# Patient Record
Sex: Female | Born: 1960 | Race: Black or African American | Hispanic: No | Marital: Single | Smoking: Never smoker
Health system: Southern US, Community
[De-identification: ages and names within clinical notes are randomized; demographics above are authoritative.]

## PROBLEM LIST (undated history)

## (undated) DIAGNOSIS — Z800 Family history of malignant neoplasm of digestive organs: Secondary | ICD-10-CM

## (undated) DIAGNOSIS — Z803 Family history of malignant neoplasm of breast: Secondary | ICD-10-CM

## (undated) DIAGNOSIS — Z8041 Family history of malignant neoplasm of ovary: Secondary | ICD-10-CM

## (undated) DIAGNOSIS — E041 Nontoxic single thyroid nodule: Secondary | ICD-10-CM

## (undated) DIAGNOSIS — T7840XA Allergy, unspecified, initial encounter: Secondary | ICD-10-CM

## (undated) HISTORY — DX: Nontoxic single thyroid nodule: E04.1

## (undated) HISTORY — PX: WISDOM TOOTH EXTRACTION: SHX21

## (undated) HISTORY — DX: Allergy, unspecified, initial encounter: T78.40XA

## (undated) HISTORY — DX: Family history of malignant neoplasm of ovary: Z80.41

## (undated) HISTORY — DX: Family history of malignant neoplasm of breast: Z80.3

## (undated) HISTORY — DX: Family history of malignant neoplasm of digestive organs: Z80.0

---

## 2001-08-31 ENCOUNTER — Other Ambulatory Visit: Admission: RE | Admit: 2001-08-31 | Discharge: 2001-08-31 | Payer: Self-pay | Admitting: Family Medicine

## 2004-03-22 ENCOUNTER — Encounter: Admission: RE | Admit: 2004-03-22 | Discharge: 2004-03-22 | Payer: Self-pay | Admitting: Family Medicine

## 2007-10-26 ENCOUNTER — Other Ambulatory Visit: Admission: RE | Admit: 2007-10-26 | Discharge: 2007-10-26 | Payer: Self-pay | Admitting: Family Medicine

## 2007-11-01 ENCOUNTER — Ambulatory Visit (HOSPITAL_COMMUNITY): Admission: RE | Admit: 2007-11-01 | Discharge: 2007-11-01 | Payer: Self-pay | Admitting: Family Medicine

## 2011-11-25 LAB — HM PAP SMEAR: HM Pap smear: NORMAL

## 2012-01-06 ENCOUNTER — Other Ambulatory Visit (HOSPITAL_COMMUNITY)
Admission: RE | Admit: 2012-01-06 | Discharge: 2012-01-06 | Disposition: A | Discharge status: Home / Self Care | Payer: 59 | Source: Ambulatory Visit | Attending: Family Medicine | Admitting: Family Medicine

## 2012-01-06 ENCOUNTER — Other Ambulatory Visit: Payer: Self-pay | Admitting: Family Medicine

## 2012-01-06 DIAGNOSIS — Z0000 Encounter for general adult medical examination without abnormal findings: Secondary | ICD-10-CM | POA: Insufficient documentation

## 2012-01-07 ENCOUNTER — Other Ambulatory Visit (HOSPITAL_COMMUNITY): Payer: Self-pay | Admitting: Family Medicine

## 2012-01-07 DIAGNOSIS — E049 Nontoxic goiter, unspecified: Secondary | ICD-10-CM

## 2012-01-13 ENCOUNTER — Ambulatory Visit (HOSPITAL_COMMUNITY)
Admission: RE | Admit: 2012-01-13 | Discharge: 2012-01-13 | Disposition: A | Discharge status: Home / Self Care | Payer: 59 | Source: Ambulatory Visit | Attending: Family Medicine | Admitting: Family Medicine

## 2012-01-13 DIAGNOSIS — E049 Nontoxic goiter, unspecified: Secondary | ICD-10-CM | POA: Insufficient documentation

## 2012-01-29 ENCOUNTER — Other Ambulatory Visit (HOSPITAL_COMMUNITY): Payer: Self-pay | Admitting: Family Medicine

## 2012-01-29 DIAGNOSIS — E042 Nontoxic multinodular goiter: Secondary | ICD-10-CM

## 2012-02-02 ENCOUNTER — Other Ambulatory Visit: Payer: Self-pay | Admitting: Family Medicine

## 2012-02-02 DIAGNOSIS — E042 Nontoxic multinodular goiter: Secondary | ICD-10-CM

## 2012-02-19 ENCOUNTER — Other Ambulatory Visit (HOSPITAL_COMMUNITY)
Admission: RE | Admit: 2012-02-19 | Discharge: 2012-02-19 | Disposition: A | Discharge status: Home / Self Care | Payer: 59 | Source: Ambulatory Visit | Attending: Interventional Radiology | Admitting: Interventional Radiology

## 2012-02-19 ENCOUNTER — Ambulatory Visit
Admission: RE | Admit: 2012-02-19 | Discharge: 2012-02-19 | Disposition: A | Discharge status: Home / Self Care | Payer: 59 | Source: Ambulatory Visit | Attending: Family Medicine | Admitting: Family Medicine

## 2012-02-19 DIAGNOSIS — E049 Nontoxic goiter, unspecified: Secondary | ICD-10-CM | POA: Insufficient documentation

## 2012-02-19 DIAGNOSIS — E042 Nontoxic multinodular goiter: Secondary | ICD-10-CM

## 2012-04-16 ENCOUNTER — Ambulatory Visit: Payer: 59 | Admitting: Endocrinology

## 2012-04-29 ENCOUNTER — Ambulatory Visit (INDEPENDENT_AMBULATORY_CARE_PROVIDER_SITE_OTHER): Payer: 59 | Admitting: Endocrinology

## 2012-04-29 ENCOUNTER — Encounter: Payer: Self-pay | Admitting: Endocrinology

## 2012-04-29 VITALS — BP 150/82 | HR 59 | Temp 98.0°F | Ht 65.0 in | Wt 135.0 lb

## 2012-04-29 DIAGNOSIS — E042 Nontoxic multinodular goiter: Secondary | ICD-10-CM

## 2012-04-29 NOTE — Progress Notes (Signed)
  Subjective:    Patient ID: Judy Ortiz, female    DOB: Sep 10, 1961, 51 y.o.   MRN: [id number]  HPI Pt was noted at recent routine physical to have an enlarged thyroid.  She says she now notices the goiter.  She has few mos of slight swelling at the anterior neck, but no assoc pain.   Past Medical History  Diagnosis Date  . Thyroid nodule     No past surgical history on file.  History   Social History  . Marital Status: Single    Spouse Name: N/A    Number of Children: 0  . Years of Education: 16   Occupational History  . Admin [employer]   Social History Main Topics  . Smoking status: Never Smoker   . Smokeless tobacco: Not on file  . Alcohol Use: Not on file  . Drug Use: No  . Sexually Active: Not on file   Other Topics Concern  . Not on file   Social History Narrative  . No narrative on file    No current outpatient prescriptions on file prior to visit.    No Known Allergies  Family History  Problem Relation Age of Onset  . Hypertension Other     Grandparents  no goiter or other thyroid probs  BP 150/82  Pulse 59  Temp(Src) 98 F (36.7 C) (Oral)  Ht 5\' 5"  (1.651 m)  Wt 135 lb (61.236 kg)  BMI 22.47 kg/m2  SpO2 99%  LMP 04/15/2012  Review of Systems denies weight loss, headache, hoarseness, double vision, palpitations, sob, diarrhea, polyuria, myalgias, excessive diaphoresis, numbness, tremor, anxiety, easy bruising, and rhinorrhea.  She has reg menses.    Objective:   Physical Exam VS: see vs page GEN: no distress HEAD: head: no deformity eyes: no periorbital swelling, no proptosis mexternal nose and ears are normal mouth: no lesion seen NECK: supple, thyroid is 2x normal size on the right, with irreg surface CHEST WALL: no deformity LUNGS:  Clear to auscultation CV: reg rate and rhythm, no murmur MUSCULOSKELETAL: muscle bulk and strength are grossly normal.  no obvious joint swelling.  gait is normal and steady EXTEMITIES: no  edema PULSES: dorsalis pedis intact bilat.  NEURO:  cn 2-12 grossly intact.   readily moves all 4's.  sensation is intact to touch on the feet SKIN:  Normal texture and temperature.  No rash or suspicious lesion is visible.   NODES:  None palpable at the neck PSYCH: alert, oriented x3.  Does not appear anxious nor depressed.   Thyroid US: Right thyroid lobe: 14 x 20 x 59 mm, inhomogeneous  Left thyroid lobe: 9 x 12 x 44 mm  Isthmus: 3 mm in thickness  Focal nodules:  13 x 14 x 20 mm solid, mid-right  13 x 22 x 31 mm solid, inferior right  4 x 11 x 11 mm solid, mid isthmus  There are two small left thyroid cysts measuring 3 mm.  Lymphadenopathy: None visualized.  cytol of bx of 2 of the nodules:  Non-neoplastic goiter.      Assessment & Plan:  Multinodular goiter, which is usually hereditary.  She is at risk for hyperthyroidism. HTN, with probable situational component.

## 2012-04-29 NOTE — Patient Instructions (Signed)
most of the time, a "lumpy thyroid" will eventually become overactive.  this is usually a slow process, happening over the span of many years. You should have a repeat ultrasound and repeat thyroid blood test in 1 year.  i would be happy to do this, or i'm sure dr white would be happy to do--your choice.

## 2012-05-01 DIAGNOSIS — E042 Nontoxic multinodular goiter: Secondary | ICD-10-CM | POA: Insufficient documentation

## 2012-05-24 IMAGING — US US THYROID BIOPSY
1 series · 13 of 17 positions shown · non-contrast
Comparison: Thyroid ultrasound dated 01/13/2012

CLINICAL DATA: Dominant right sided thyroid nodules measuring
approximately 2.0 and 3.1 cm in respective maximal diameters.
These are located in the right lobe.

ULTRASOUND GUIDED NEEDLE ASPIRATE BIOPSY OF THE THYROID GLAND X 2

[Series 1: us thyroid biopsy · 0.08mm/px · 17 acquisitions, 13 frames shown]
[im 1/17]
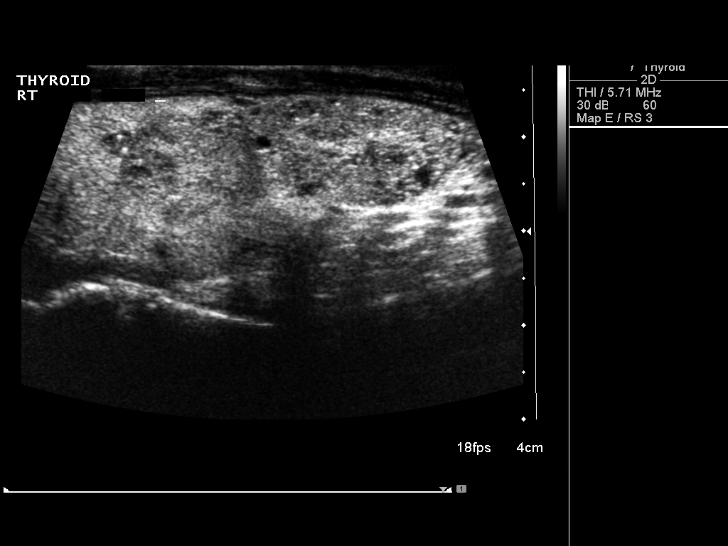
[im 2/17]
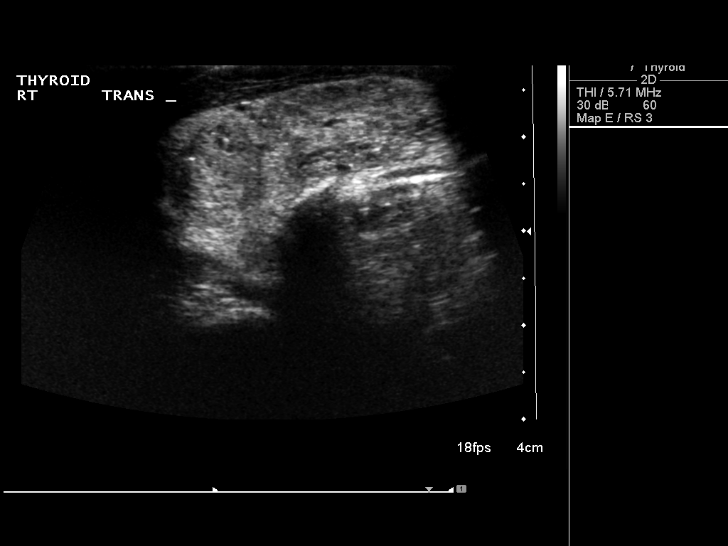
[im 4/17]
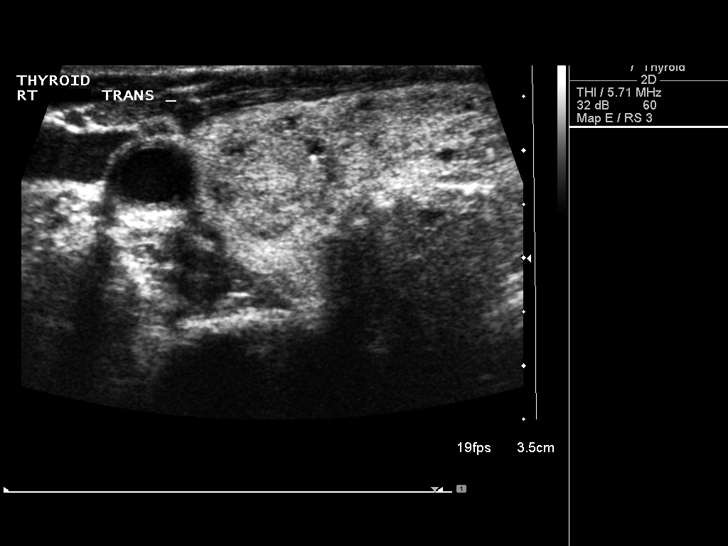
[im 5/17]
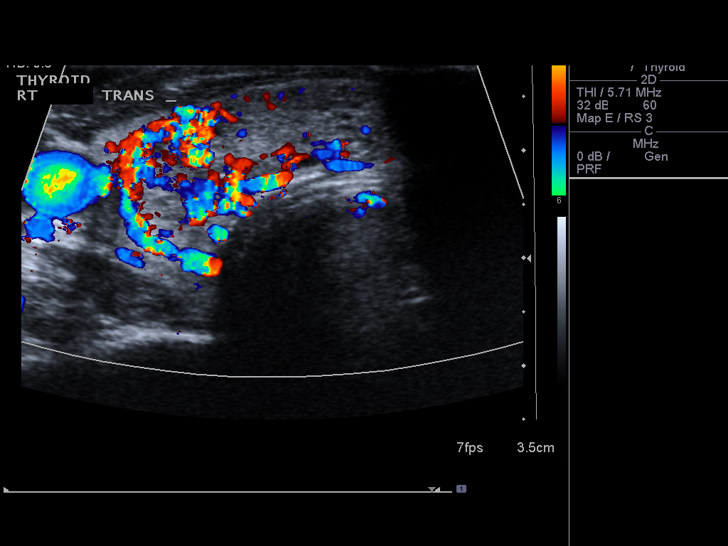
[im 6/17]
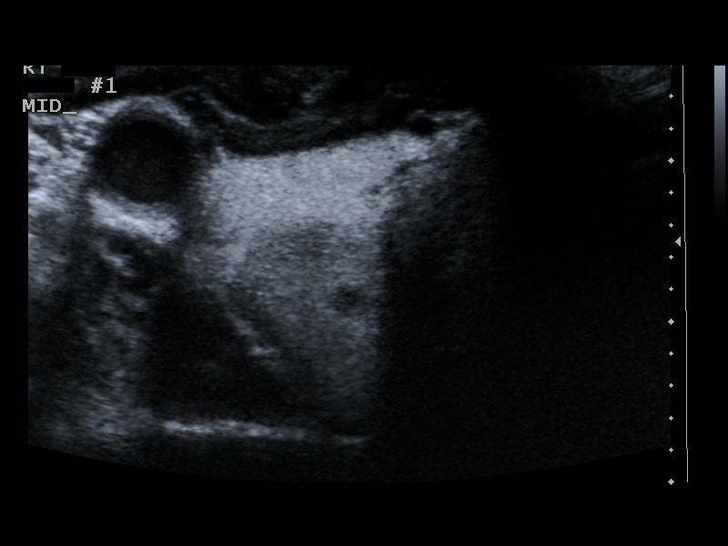
[im 8/17]
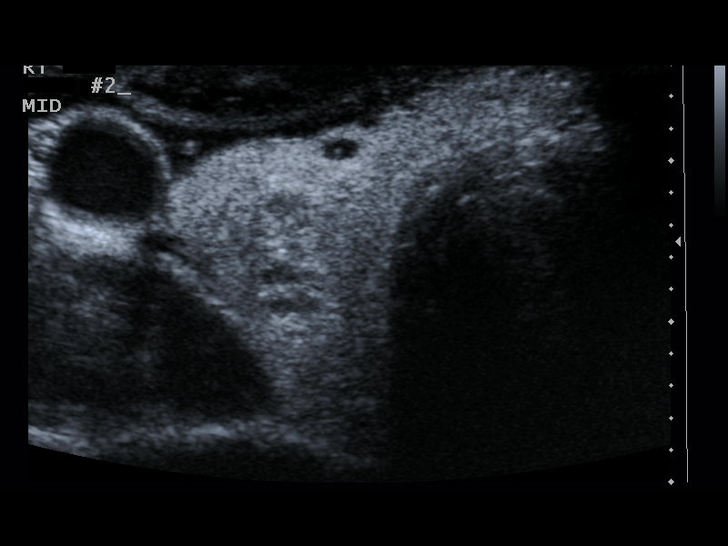
[im 9/17]
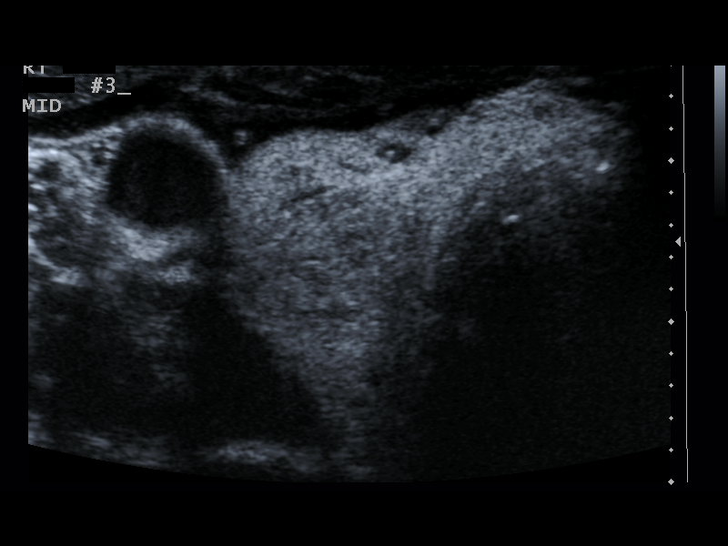
[im 10/17]
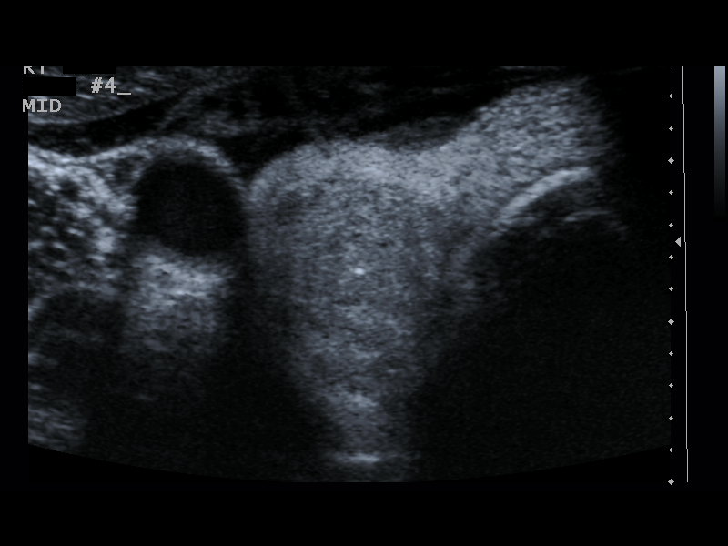
[im 12/17]
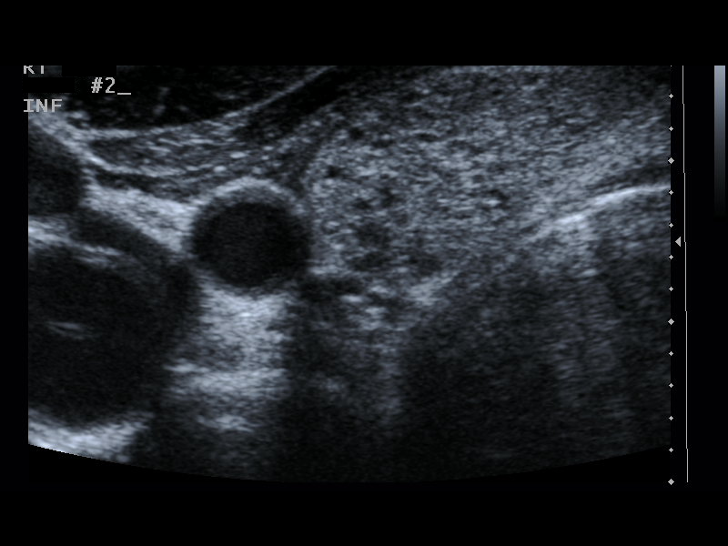
[im 13/17]
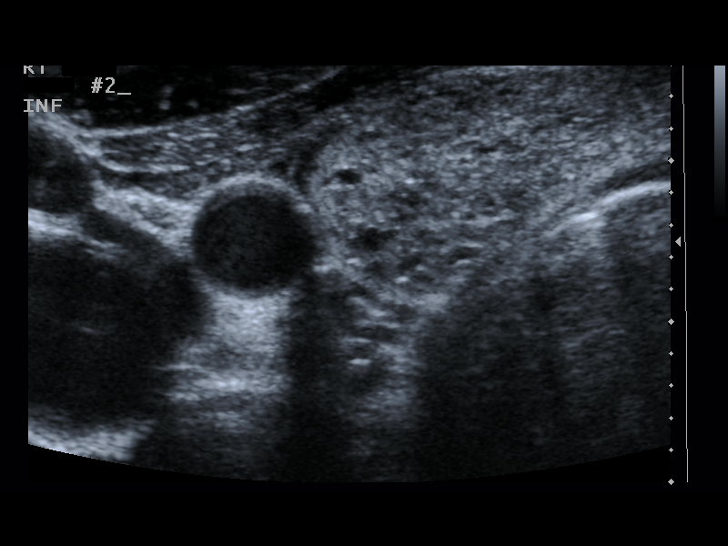
[im 14/17]
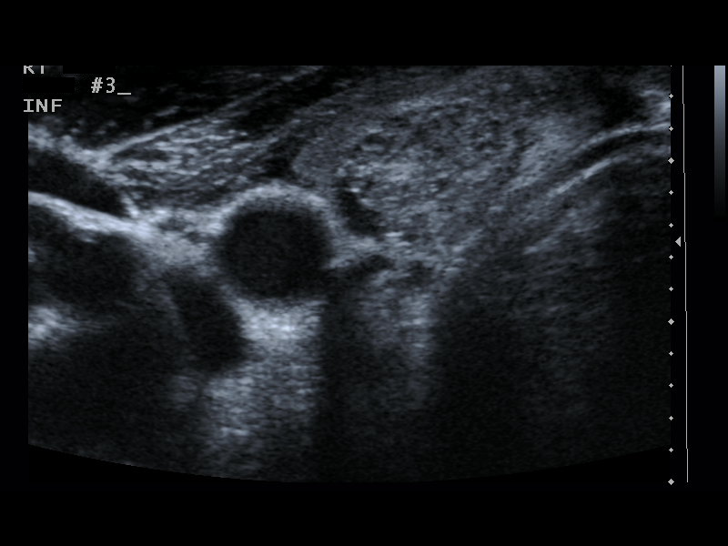
[im 16/17]
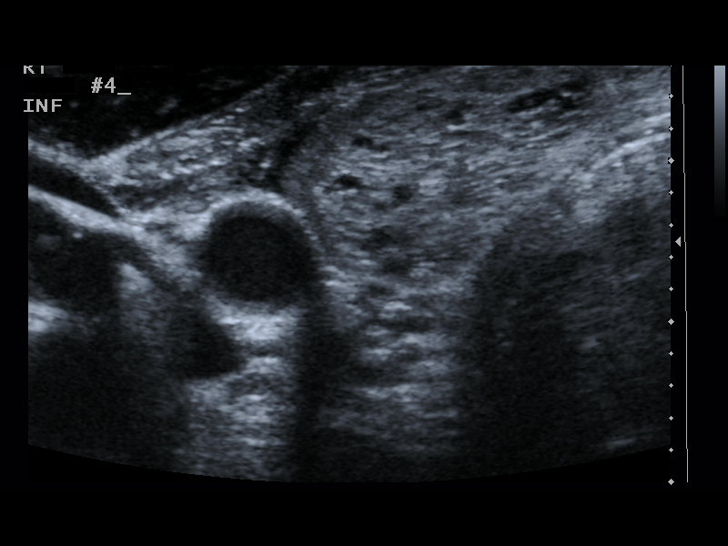
[im 17/17]
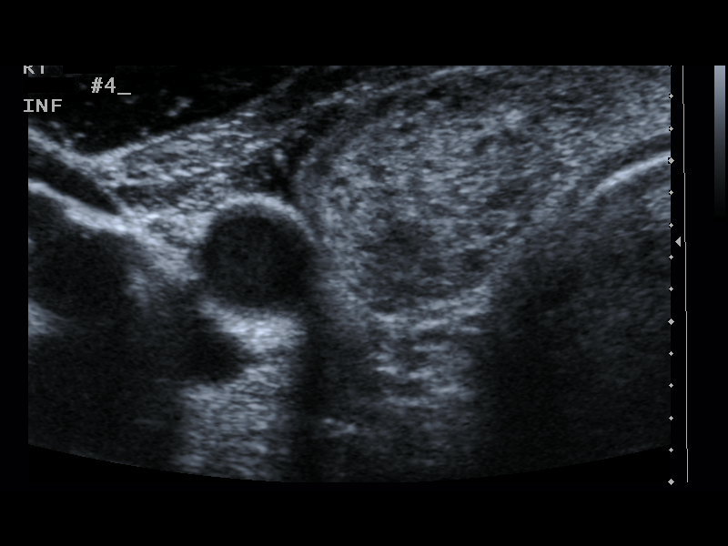

[13 of 17 positions shown; findings below may reference images not displayed]

Thyroid biopsy was thoroughly discussed with the patient and
questions were answered.  The benefits, risks, alternatives, and
complications were also discussed.  The patient understands and
wishes to proceed with the procedure.  Written consent was
obtained.

Ultrasound was performed to localize and mark an adequate site for
the biopsy.  The patient was then prepped and draped in a normal
sterile fashion.  Local anesthesia was provided with 1% lidocaine.

Using direct ultrasound guidance, 4 passes were made using 25 gauge
needles into the nodule within the mid right lobe of the thyroid.
This was followed by ultrasound guided aspirate of the lower right
thyroid nodule utilizing similar 25 gauge needles and four total
passes.

Ultrasound was used to confirm needle placements on all occasions.
Specimens were sent to Pathology for analysis.

Complications:  None
FINDINGS: There is no gross change in morphology of the right mid
and lower thyroid nodules which are located very close in proximity
to each other and blend into each other.
IMPRESSION: Ultrasound guided needle aspirate biopsy performed of two separate
dominant right thyroid nodules.

## 2015-05-23 ENCOUNTER — Other Ambulatory Visit: Payer: Self-pay | Admitting: Family Medicine

## 2015-05-23 ENCOUNTER — Other Ambulatory Visit (HOSPITAL_COMMUNITY)
Admission: RE | Admit: 2015-05-23 | Discharge: 2015-05-23 | Disposition: A | Discharge status: Home / Self Care | Payer: 59 | Source: Ambulatory Visit | Attending: Family Medicine | Admitting: Family Medicine

## 2015-05-23 DIAGNOSIS — Z01419 Encounter for gynecological examination (general) (routine) without abnormal findings: Secondary | ICD-10-CM | POA: Insufficient documentation

## 2015-05-24 LAB — CYTOLOGY - PAP

## 2015-08-07 ENCOUNTER — Encounter: Payer: Self-pay | Admitting: Gastroenterology

## 2015-10-12 ENCOUNTER — Ambulatory Visit (AMBULATORY_SURGERY_CENTER): Payer: Self-pay | Admitting: *Deleted

## 2015-10-12 ENCOUNTER — Telehealth: Payer: Self-pay | Admitting: *Deleted

## 2015-10-12 VITALS — Ht 65.5 in | Wt 145.0 lb

## 2015-10-12 DIAGNOSIS — Z1211 Encounter for screening for malignant neoplasm of colon: Secondary | ICD-10-CM

## 2015-10-12 MED ORDER — NA SULFATE-K SULFATE-MG SULF 17.5-3.13-1.6 GM/177ML PO SOLN: 1.0000 | Freq: Once | ORAL | Status: DC

## 2015-10-12 NOTE — Progress Notes (Signed)
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 Her reaction is that they make her "feel funny" . She doesn't eat eggs, she has gluten intolerance , she does ok with soy she just doesn't do soy products  No issues with past sedation with wisdom teeth No diet pills No home 02 use   emmi video to [email or web address]

## 2015-10-12 NOTE — Telephone Encounter (Signed)
Judy Ortiz, This pt , who is a [employer] employee states she is allergic to eggs. But sounds like intolerance. She states they make jer feel funny. She doesn't eat them at all. No soy allergy but she doesn't do soy either. Just an FYI to make sure she is good for propofol.  Thanks [employer]

## 2015-10-14 NOTE — Telephone Encounter (Signed)
Marie,  This pt is cleared for anesthetic care at [facility].  Thanks,  Oluwatimileyin Vivier 

## 2015-10-15 NOTE — Telephone Encounter (Signed)
Will proceed as scheduled  

## 2015-10-26 ENCOUNTER — Encounter: Payer: Self-pay | Admitting: Gastroenterology

## 2015-10-26 ENCOUNTER — Ambulatory Visit (AMBULATORY_SURGERY_CENTER): Payer: 59 | Admitting: Gastroenterology

## 2015-10-26 VITALS — BP 112/55 | HR 53 | Temp 96.7°F | Resp 16 | Ht 65.0 in | Wt 145.0 lb

## 2015-10-26 DIAGNOSIS — Z1211 Encounter for screening for malignant neoplasm of colon: Secondary | ICD-10-CM | POA: Diagnosis present

## 2015-10-26 DIAGNOSIS — D122 Benign neoplasm of ascending colon: Secondary | ICD-10-CM

## 2015-10-26 MED ORDER — SODIUM CHLORIDE 0.9 % IV SOLN: 500.0000 mL | INTRAVENOUS | Status: DC

## 2015-10-26 NOTE — Progress Notes (Signed)
Called to room to assist during endoscopic procedure.  Patient ID and intended procedure confirmed with present staff. Received instructions for my participation in the procedure from the performing physician.  

## 2015-10-26 NOTE — Op Note (Signed)
[place]  [address] [place], [address]   COLONOSCOPY PROCEDURE REPORT  PATIENT: Judy Ortiz, Judy Ortiz  MR#: [id number] BIRTHDATE: Nov 20, 1961 , 77  yrs. old GENDER: female ENDOSCOPIST: Milus Banister, MD REFERRED [id number] Dema Severin, M.D. PROCEDURE DATE:  10/26/2015 PROCEDURE:   Colonoscopy, screening and Colonoscopy with snare polypectomy First Screening Colonoscopy - Avg.  risk and is 50 yrs.  old or older Yes.  Prior Negative Screening - Now for repeat screening. N/A  History of Adenoma - Now for follow-up colonoscopy & has been > or = to 3 yrs.  N/A  Polyps removed today? Yes ASA CLASS:   Class II INDICATIONS:Screening for colonic neoplasia and Colorectal Neoplasm Risk Assessment for this procedure is average risk. MEDICATIONS: Monitored anesthesia care and Propofol 200 mg IV  DESCRIPTION OF PROCEDURE:   After the risks benefits and alternatives of the procedure were thoroughly explained, informed consent was obtained.  The digital rectal exam revealed no abnormalities of the rectum.   The LB [id number]  endoscope was introduced through the anus and advanced to the cecum, which was identified by both the appendix and ileocecal valve. No adverse events experienced.   The quality of the prep was excellent.  The instrument was then slowly withdrawn as the colon was fully examined. Estimated blood loss is zero unless otherwise noted in this procedure report.   COLON FINDINGS: A sessile polyp measuring 4 mm in size was found in the ascending colon.  A polypectomy was performed with a cold snare.  The resection was complete, the polyp tissue was completely retrieved and sent to histology.   The examination was otherwise normal.  Retroflexed views revealed no abnormalities. The time to cecum = 3.2 Withdrawal time = 13.1   The scope was withdrawn and the procedure completed. COMPLICATIONS: There were no immediate complications.  ENDOSCOPIC IMPRESSION: 1.    Sessile polyp was found in the ascending colon; polypectomy was performed with a cold snare 2.   The examination was otherwise normal  RECOMMENDATIONS: If the polyp(s) removed today are proven to be adenomatous (pre-cancerous) polyps, you will need a repeat colonoscopy in 5 years.  Otherwise you should continue to follow colorectal cancer screening guidelines for "routine risk" patients with colonoscopy in 10 years.  You will receive a letter within 1-2 weeks with the results of your biopsy as well as final recommendations.  Please call my office if you have not received a letter after 3 weeks.  eSigned:  Milus Banister, MD 10/26/2015 10:10 AM

## 2015-10-26 NOTE — Progress Notes (Signed)
Report to PACU, RN, vss, BBS= Clear.  

## 2015-10-26 NOTE — Patient Instructions (Signed)
YOU HAD AN ENDOSCOPIC PROCEDURE TODAY AT THE [facility]:   Refer to the procedure report that was given to you for any specific questions about what was found during the examination.  If the procedure report does not answer your questions, please call your gastroenterologist to clarify.  If you requested that your care partner not be given the details of your procedure findings, then the procedure report has been included in a sealed envelope for you to review at your convenience later.  YOU SHOULD EXPECT: Some feelings of bloating in the abdomen. Passage of more gas than usual.  Walking can help get rid of the air that was put into your GI tract during the procedure and reduce the bloating. If you had a lower endoscopy (such as a colonoscopy or flexible sigmoidoscopy) you may notice spotting of blood in your stool or on the toilet paper. If you underwent a bowel prep for your procedure, you may not have a normal bowel movement for a few days.  Please Note:  You might notice some irritation and congestion in your nose or some drainage.  This is from the oxygen used during your procedure.  There is no need for concern and it should clear up in a day or so.  SYMPTOMS TO REPORT IMMEDIATELY:   Following lower endoscopy (colonoscopy or flexible sigmoidoscopy):  Excessive amounts of blood in the stool  Significant tenderness or worsening of abdominal pains  Swelling of the abdomen that is new, acute  Fever of 100F or higher   For urgent or emergent issues, a gastroenterologist can be reached at any hour by calling [phone].   DIET: Your first meal following the procedure should be a small meal and then it is ok to progress to your normal diet. Heavy or fried foods are harder to digest and may make you feel nauseous or bloated.  Likewise, meals heavy in dairy and vegetables can increase bloating.  Drink plenty of fluids but you should avoid alcoholic beverages for 24  hours.  ACTIVITY:  You should plan to take it easy for the rest of today and you should NOT DRIVE or use heavy machinery until tomorrow (because of the sedation medicines used during the test).    FOLLOW UP: Our staff will call the number listed on your records the next business day following your procedure to check on you and address any questions or concerns that you may have regarding the information given to you following your procedure. If we do not reach you, we will leave a message.  However, if you are feeling well and you are not experiencing any problems, there is no need to return our call.  We will assume that you have returned to your regular daily activities without incident.  If any biopsies were taken you will be contacted by phone or by letter within the next 1-3 weeks.  Please call us at [phone] if you have not heard about the biopsies in 3 weeks.    SIGNATURES/CONFIDENTIALITY: You and/or your care partner have signed paperwork which will be entered into your electronic medical record.  These signatures attest to the fact that that the information above on your After Visit Summary has been reviewed and is understood.  Full responsibility of the confidentiality of this discharge information lies with you and/or your care-partner. 

## 2015-10-29 ENCOUNTER — Telehealth: Payer: Self-pay

## 2015-10-29 NOTE — Telephone Encounter (Signed)
  Follow up Call-  Call back number 10/26/2015  Post procedure Call Back phone  # [phone]  Permission to leave phone message Yes     Patient questions:  Do you have a fever, pain , or abdominal swelling? No. Pain Score  0 *  Have you tolerated food without any problems? Yes.    Have you been able to return to your normal activities? Yes.    Do you have any questions about your discharge instructions: Diet   No. Medications  No. Follow up visit  No.  Do you have questions or concerns about your Care? No.  Actions: * If pain score is 4 or above: No action needed, pain <4.

## 2015-11-05 ENCOUNTER — Encounter: Payer: Self-pay | Admitting: Gastroenterology

## 2016-07-04 DIAGNOSIS — Z8041 Family history of malignant neoplasm of ovary: Secondary | ICD-10-CM | POA: Diagnosis not present

## 2016-07-04 DIAGNOSIS — Z13220 Encounter for screening for lipoid disorders: Secondary | ICD-10-CM | POA: Diagnosis not present

## 2016-07-04 DIAGNOSIS — E041 Nontoxic single thyroid nodule: Secondary | ICD-10-CM | POA: Diagnosis not present

## 2016-07-04 DIAGNOSIS — Z0000 Encounter for general adult medical examination without abnormal findings: Secondary | ICD-10-CM | POA: Diagnosis not present

## 2016-08-01 ENCOUNTER — Telehealth: Payer: Self-pay | Admitting: Genetic Counselor

## 2016-08-01 ENCOUNTER — Encounter: Payer: Self-pay | Admitting: Genetic Counselor

## 2016-08-01 NOTE — Telephone Encounter (Signed)
Pt called to schedule a genetic counseling appt. Appt with Roma Kayser on 9/11 at 10am. Pt aware to arrive 30 minutes early.

## 2016-08-04 ENCOUNTER — Encounter: Payer: Self-pay | Admitting: Genetic Counselor

## 2016-08-04 ENCOUNTER — Ambulatory Visit (HOSPITAL_BASED_OUTPATIENT_CLINIC_OR_DEPARTMENT_OTHER): Payer: 59 | Admitting: Genetic Counselor

## 2016-08-04 ENCOUNTER — Other Ambulatory Visit: Payer: 59

## 2016-08-04 DIAGNOSIS — Z803 Family history of malignant neoplasm of breast: Secondary | ICD-10-CM | POA: Diagnosis not present

## 2016-08-04 DIAGNOSIS — Z8041 Family history of malignant neoplasm of ovary: Secondary | ICD-10-CM

## 2016-08-04 DIAGNOSIS — E042 Nontoxic multinodular goiter: Secondary | ICD-10-CM

## 2016-08-04 DIAGNOSIS — Z800 Family history of malignant neoplasm of digestive organs: Secondary | ICD-10-CM | POA: Diagnosis not present

## 2016-08-04 NOTE — Progress Notes (Signed)
REFERRING PROVIDER: Harlan Stains, MD [address], [place] [address]  PRIMARY PROVIDER:  Vidal Schwalbe, MD  PRIMARY REASON FOR VISIT:  1. Multinodular goiter   2. Family history of ovarian cancer   3. Family history of colon cancer   4. Family history of breast cancer      HISTORY OF PRESENT ILLNESS:   Ms. Gudino, a 55 y.o. female, was seen for a [facility] cancer genetics consultation at the request of Dr. Dema Severin due to a family history of cancer.  Ms. Harnden presents to clinic today to discuss the possibility of a hereditary predisposition to cancer, genetic testing, and to further clarify her future cancer risks, as well as potential cancer risks for family members.  Ms. Parkhurst is a 55 y.o. female with no personal history of cancer.  Her mother was diagnosed with ovarian cancer as well as her maternal aunt.  She is interested in undergoing genetic testing.  CANCER HISTORY:   No history exists.     HORMONAL RISK FACTORS:  Menarche was at age 26-13.  First live birth at age 89.  OCP use for approximately 25 years.  Ovaries intact: yes.  Hysterectomy: no.  Menopausal status: perimenopausal.  HRT use: 0 years. Colonoscopy: yes; 1 polyps. Mammogram within the last year: no. Number of breast biopsies: 0. Up to date with pelvic exams:  yes. Any excessive radiation exposure in the past:  no  Past Medical History:  Diagnosis Date  . Allergy    seasonal  . Family history of breast cancer   . Family history of colon cancer   . Family history of ovarian cancer   . Thyroid nodule     Past Surgical History:  Procedure Laterality Date  . WISDOM TOOTH EXTRACTION     with sedation    Social History   Social History  . Marital status: Single    Spouse name: N/A  . Number of children: 0  . Years of education: 39   Occupational History  . [employer]   Social History Main Topics  . Smoking status: Never Smoker  . Smokeless tobacco: Never  Used  . Alcohol use 0.0 oz/week     Comment: occasionally  . Drug use: No  . Sexual activity: Not Asked   Other Topics Concern  . None   Social History Narrative  . None     FAMILY HISTORY:  We obtained a detailed, 4-generation family history.  Significant diagnoses are listed below: Family History  Problem Relation Age of Onset  . Hypertension Other     Grandparents  . Colon cancer Maternal Grandmother     80's  . Ovarian cancer Mother 69  . Liver disease Father   . Ovarian cancer Maternal Aunt 62  . Breast cancer Other     MGF's mother  . Colon polyps Neg Hx   . Esophageal cancer Neg Hx   . Rectal cancer Neg Hx   . Stomach cancer Neg Hx     The patient does not have children and is the only child to her parents.  Her mother was diagnosed and died of ovarian cancer at age 54.  She has a full sister and a maternal half sister.  The full sister was diagnosed with ovarian cancer at 47 and died at 28.  The patient's maternal grandmother was diagnosed with colon cancer in her 26's and her maternal grandfather's mother had breast cancer.  The patient's father died of  liver disease at 73.  He had two brothers and three sisters.  One sister had an unknown form of cancer and this sister's daughter died of an unknown form of cancer.  There is no other reported family history of cancer.  Patient's maternal ancestors are of [place] American and Native American descent, and paternal ancestors are of African American descent. There is no reported Ashkenazi Jewish ancestry. There is no known consanguinity.  GENETIC COUNSELING ASSESSMENT: CHARMIN AGUINIGA is a 55 y.o. female with a family history of ovarian, colon and breast cancer which is somewhat suggestive of a hereditary cancer syndrome and predisposition to cancer. We, therefore, discussed and recommended the following at today's visit.   DISCUSSION: We discussed that about 15-20% of ovarian cancer is due to hereditary causes.  Most commonly  it is associated with BRCA mutations, but sometimes it can be associated with Lynch syndrome.  There are other genes that can also increase the risk for ovarian cancer, but are not as well delineated.  We reviewed the characteristics, features and inheritance patterns of hereditary cancer syndromes. We also discussed genetic testing, including the appropriate family members to test, the process of testing, insurance coverage and turn-around-time for results. We discussed the implications of a negative, positive and/or variant of uncertain significant result. We recommended Ms. Halberg pursue genetic testing for the Breast/Ovarian cancer gene panel. The Breast/Ovarian gene panel offered by [facility] includes sequencing and rearrangement analysis for the following 20 genes:  ATM, BARD1, BRCA1, BRCA2, BRIP1, CDH1, CHEK2, EPCAM, FANCC, MLH1, MSH2, MSH6, NBN, PALB2, PMS2, PTEN, RAD51C, RAD51D, TP53, and XRCC2.     Based on Ms. Pietila's family history of cancer, she meets medical criteria for genetic testing. Despite that she meets criteria, she may still have an out of pocket cost. We discussed that if her out of pocket cost for testing is over $100, the laboratory will call and confirm whether she wants to proceed with testing.  If the out of pocket cost of testing is less than $100 she will be billed by the genetic testing laboratory.   PLAN: After considering the risks, benefits, and limitations, Ms. Patton  provided informed consent to pursue genetic testing and the blood sample was sent to [facility] for analysis of the Breast/Ovarian cancer gene panel. Results should be available within approximately 2-3 weeks' time, at which point they will be disclosed by telephone to Ms. Taite, as will any additional recommendations warranted by these results. Ms. Fulghum will receive a summary of her genetic counseling visit and a copy of her results once available. This information will also be available in Epic. We  encouraged Ms. Yannuzzi to remain in contact with cancer genetics annually so that we can continuously update the family history and inform her of any changes in cancer genetics and testing that may be of benefit for her family. Ms. Maxim questions were answered to her satisfaction today. Our contact information was provided should additional questions or concerns arise.  Lastly, we encouraged Ms. Steers to remain in contact with cancer genetics annually so that we can continuously update the family history and inform her of any changes in cancer genetics and testing that may be of benefit for this family.   Ms.  Peabody questions were answered to her satisfaction today. Our contact information was provided should additional questions or concerns arise. Thank you for the referral and allowing [place] to share in the care of your patient.   Quaran Kedzierski P. Florene Glen, MS, [place]  Certified [email or web address] phone: [phone]  The patient was seen for a total of 30 minutes in face-to-face genetic counseling.  This patient was discussed with Drs. Magrinat, Lindi Adie and/or Burr Medico who agrees with the above.    _______________________________________________________________________ For Office Staff:  Number of people involved in session: 1 Was an Intern/ student involved with case: no

## 2016-08-07 DIAGNOSIS — Z800 Family history of malignant neoplasm of digestive organs: Secondary | ICD-10-CM | POA: Diagnosis not present

## 2016-08-07 DIAGNOSIS — Z8041 Family history of malignant neoplasm of ovary: Secondary | ICD-10-CM | POA: Diagnosis not present

## 2016-08-07 DIAGNOSIS — E042 Nontoxic multinodular goiter: Secondary | ICD-10-CM | POA: Diagnosis not present

## 2016-08-07 DIAGNOSIS — Z803 Family history of malignant neoplasm of breast: Secondary | ICD-10-CM | POA: Diagnosis not present

## 2016-08-15 ENCOUNTER — Telehealth: Payer: Self-pay | Admitting: Genetic Counselor

## 2016-08-15 ENCOUNTER — Encounter: Payer: Self-pay | Admitting: Genetic Counselor

## 2016-08-15 ENCOUNTER — Ambulatory Visit: Payer: Self-pay | Admitting: Genetic Counselor

## 2016-08-15 DIAGNOSIS — Z800 Family history of malignant neoplasm of digestive organs: Secondary | ICD-10-CM

## 2016-08-15 DIAGNOSIS — Z1379 Encounter for other screening for genetic and chromosomal anomalies: Secondary | ICD-10-CM | POA: Insufficient documentation

## 2016-08-15 DIAGNOSIS — Z803 Family history of malignant neoplasm of breast: Secondary | ICD-10-CM

## 2016-08-15 DIAGNOSIS — Z8041 Family history of malignant neoplasm of ovary: Secondary | ICD-10-CM

## 2016-08-15 NOTE — Telephone Encounter (Signed)
Revealed negative genetic testing on the breast/ovarian cancer panel.   

## 2016-08-15 NOTE — Progress Notes (Signed)
HPI: Judy Ortiz was previously seen in the [facility] clinic due to a family history of cancer and concerns regarding a hereditary predisposition to cancer. Please refer to our prior cancer genetics clinic note for more information regarding Judy Ortiz's medical, social and family histories, and our assessment and recommendations, at the time. Judy Ortiz recent genetic test results were disclosed to her, as were recommendations warranted by these results. These results and recommendations are discussed in more detail below.  FAMILY HISTORY:  We obtained a detailed, 4-generation family history.  Significant diagnoses are listed below: Family History  Problem Relation Age of Onset  . Hypertension Other     Grandparents  . Colon cancer Maternal Grandmother     80's  . Ovarian cancer Mother 63  . Liver disease Father   . Ovarian cancer Maternal Aunt 62  . Breast cancer Other     MGF's mother  . Colon polyps Neg Hx   . Esophageal cancer Neg Hx   . Rectal cancer Neg Hx   . Stomach cancer Neg Hx     The patient does not have children and is the only child to her parents.  Her mother was diagnosed and died of ovarian cancer at age 72.  She has a full sister and a maternal half sister.  The full sister was diagnosed with ovarian cancer at 39 and died at 40.  The patient's maternal grandmother was diagnosed with colon cancer in her 26's and her maternal grandfather's mother had breast cancer.  The patient's father died of liver disease at 50.  He had two brothers and three sisters.  One sister had an unknown form of cancer and this sister's daughter died of an unknown form of cancer.  There is no other reported family history of cancer.  Patient's maternal ancestors are of [place] American and Native American descent, and paternal ancestors are of African American descent. There is no reported Ashkenazi Jewish ancestry. There is no known consanguinity.  GENETIC TEST RESULTS: At the time  of Judy Ortiz's visit, we recommended she pursue genetic testing of the Breast/Ovarian cancer gene panel. The Breast/Ovarian gene panel offered by [facility] includes sequencing and rearrangement analysis for the following 20 genes:  ATM, BARD1, BRCA1, BRCA2, BRIP1, CDH1, CHEK2, EPCAM, FANCC, MLH1, MSH2, MSH6, NBN, PALB2, PMS2, PTEN, RAD51C, RAD51D, TP53, and XRCC2.   The report date is August 13, 2016.  Genetic testing was normal, and did not reveal a deleterious mutation in these genes. The test report has been scanned into EPIC and is located under the Molecular Pathology section of the Results Review tab.   We discussed with Judy Ortiz that since the current genetic testing is not perfect, it is possible there may be a gene mutation in one of these genes that current testing cannot detect, but that chance is small. We also discussed, that it is possible that another gene that has not yet been discovered, or that we have not yet tested, is responsible for the cancer diagnoses in the family, and it is, therefore, important to remain in touch with cancer genetics in the future so that we can continue to offer Judy Ortiz the most up to date genetic testing.   CANCER SCREENING RECOMMENDATIONS: This normal result is reassuring and indicates that Judy Ortiz does not likely have an increased risk of cancer due to a mutation in one of these genes.  We, therefore, recommended  Judy Ortiz continue to follow the cancer screening  guidelines provided by her primary healthcare providers.   RECOMMENDATIONS FOR FAMILY MEMBERS: Women in this family might be at some increased risk of developing cancer, over the general population risk, simply due to the family history of cancer. We recommended women in this family have a yearly mammogram beginning at age 65, or 37 years younger than the earliest onset of cancer, an an annual clinical breast exam, and perform monthly breast self-exams. Women in this family should also have a  gynecological exam as recommended by their primary provider. All family members should have a colonoscopy by age 40.  FOLLOW-UP: Lastly, we discussed with Judy Ortiz that cancer genetics is a rapidly advancing field and it is possible that new genetic tests will be appropriate for her and/or her family members in the future. We encouraged her to remain in contact with cancer genetics on an annual basis so we can update her personal and family histories and let her know of advances in cancer genetics that may benefit this family.   Our contact number was provided. Judy Ortiz questions were answered to her satisfaction, and she knows she is welcome to call us at anytime with additional questions or concerns.   Judy Kayser, MS, [facility] Certified Genetic Counselor [email or web address]

## 2016-09-30 DIAGNOSIS — M9902 Segmental and somatic dysfunction of thoracic region: Secondary | ICD-10-CM | POA: Diagnosis not present

## 2016-09-30 DIAGNOSIS — M791 Myalgia: Secondary | ICD-10-CM | POA: Diagnosis not present

## 2016-09-30 DIAGNOSIS — M9905 Segmental and somatic dysfunction of pelvic region: Secondary | ICD-10-CM | POA: Diagnosis not present

## 2016-09-30 DIAGNOSIS — M9903 Segmental and somatic dysfunction of lumbar region: Secondary | ICD-10-CM | POA: Diagnosis not present

## 2017-02-12 ENCOUNTER — Encounter (HOSPITAL_COMMUNITY): Payer: Self-pay

## 2017-10-09 DIAGNOSIS — Z0000 Encounter for general adult medical examination without abnormal findings: Secondary | ICD-10-CM | POA: Diagnosis not present

## 2017-10-09 DIAGNOSIS — R030 Elevated blood-pressure reading, without diagnosis of hypertension: Secondary | ICD-10-CM | POA: Diagnosis not present

## 2017-10-09 DIAGNOSIS — E785 Hyperlipidemia, unspecified: Secondary | ICD-10-CM | POA: Diagnosis not present

## 2017-12-01 MED FILL — FLUTICASONE PROP 50 MCG SPR: 50 | 30 days supply | Qty: 16 | Fill #0

## 2017-12-21 ENCOUNTER — Other Ambulatory Visit: Payer: Self-pay | Admitting: Family Medicine

## 2017-12-21 DIAGNOSIS — Z1231 Encounter for screening mammogram for malignant neoplasm of breast: Secondary | ICD-10-CM

## 2018-01-13 ENCOUNTER — Ambulatory Visit
Admission: RE | Admit: 2018-01-13 | Discharge: 2018-01-13 | Disposition: A | Discharge status: Home / Self Care | Payer: 59 | Source: Ambulatory Visit | Attending: Family Medicine | Admitting: Family Medicine

## 2018-01-13 DIAGNOSIS — Z1231 Encounter for screening mammogram for malignant neoplasm of breast: Secondary | ICD-10-CM

## 2018-04-17 ENCOUNTER — Encounter (HOSPITAL_COMMUNITY): Payer: Self-pay | Admitting: Oncology

## 2019-02-28 DIAGNOSIS — J301 Allergic rhinitis due to pollen: Secondary | ICD-10-CM | POA: Diagnosis not present

## 2019-02-28 DIAGNOSIS — S92354D Nondisplaced fracture of fifth metatarsal bone, right foot, subsequent encounter for fracture with routine healing: Secondary | ICD-10-CM | POA: Diagnosis not present

## 2019-02-28 DIAGNOSIS — R030 Elevated blood-pressure reading, without diagnosis of hypertension: Secondary | ICD-10-CM | POA: Diagnosis not present

## 2021-03-01 ENCOUNTER — Other Ambulatory Visit: Payer: Self-pay | Admitting: Physician Assistant

## 2021-03-01 ENCOUNTER — Other Ambulatory Visit (HOSPITAL_COMMUNITY)
Admission: RE | Admit: 2021-03-01 | Discharge: 2021-03-01 | Disposition: A | Discharge status: Home / Self Care | Payer: 59 | Source: Ambulatory Visit | Attending: Physician Assistant | Admitting: Physician Assistant

## 2021-03-01 DIAGNOSIS — Z23 Encounter for immunization: Secondary | ICD-10-CM | POA: Diagnosis not present

## 2021-03-01 DIAGNOSIS — Z0000 Encounter for general adult medical examination without abnormal findings: Secondary | ICD-10-CM | POA: Insufficient documentation

## 2021-03-01 DIAGNOSIS — E785 Hyperlipidemia, unspecified: Secondary | ICD-10-CM | POA: Diagnosis not present

## 2021-03-01 DIAGNOSIS — R030 Elevated blood-pressure reading, without diagnosis of hypertension: Secondary | ICD-10-CM | POA: Diagnosis not present

## 2021-03-05 LAB — CYTOLOGY - PAP
Comment: NEGATIVE
Diagnosis: NEGATIVE
High risk HPV: NEGATIVE

## 2021-06-14 ENCOUNTER — Other Ambulatory Visit (HOSPITAL_COMMUNITY): Payer: Self-pay

## 2021-06-14 MED ORDER — CARESTART COVID-19 HOME TEST VI KIT
PACK | 0 refills | Status: AC
  Filled 2021-06-14: qty 4, 4d supply, fill #0

## 2021-06-25 ENCOUNTER — Other Ambulatory Visit (HOSPITAL_COMMUNITY): Payer: Self-pay

## 2021-12-25 LAB — HM DIABETES EYE EXAM

## 2022-03-12 DIAGNOSIS — Z8041 Family history of malignant neoplasm of ovary: Secondary | ICD-10-CM | POA: Diagnosis not present

## 2022-03-12 DIAGNOSIS — Z0000 Encounter for general adult medical examination without abnormal findings: Secondary | ICD-10-CM | POA: Diagnosis not present

## 2022-03-12 DIAGNOSIS — E2839 Other primary ovarian failure: Secondary | ICD-10-CM | POA: Diagnosis not present

## 2022-03-12 DIAGNOSIS — Z113 Encounter for screening for infections with a predominantly sexual mode of transmission: Secondary | ICD-10-CM | POA: Diagnosis not present

## 2022-03-12 DIAGNOSIS — E049 Nontoxic goiter, unspecified: Secondary | ICD-10-CM | POA: Diagnosis not present

## 2022-03-12 DIAGNOSIS — E785 Hyperlipidemia, unspecified: Secondary | ICD-10-CM | POA: Diagnosis not present

## 2022-03-12 DIAGNOSIS — J301 Allergic rhinitis due to pollen: Secondary | ICD-10-CM | POA: Diagnosis not present

## 2022-03-12 DIAGNOSIS — D72819 Decreased white blood cell count, unspecified: Secondary | ICD-10-CM | POA: Diagnosis not present

## 2022-03-13 ENCOUNTER — Other Ambulatory Visit: Payer: Self-pay | Admitting: Family Medicine

## 2022-03-13 DIAGNOSIS — E049 Nontoxic goiter, unspecified: Secondary | ICD-10-CM

## 2022-03-14 ENCOUNTER — Other Ambulatory Visit: Payer: Self-pay | Admitting: Family Medicine

## 2022-03-14 DIAGNOSIS — E2839 Other primary ovarian failure: Secondary | ICD-10-CM

## 2022-03-17 ENCOUNTER — Other Ambulatory Visit: Payer: 59

## 2022-03-27 ENCOUNTER — Ambulatory Visit
Admission: RE | Admit: 2022-03-27 | Discharge: 2022-03-27 | Disposition: A | Discharge status: Home / Self Care | Payer: 59 | Source: Ambulatory Visit | Attending: Family Medicine | Admitting: Family Medicine

## 2022-03-27 ENCOUNTER — Other Ambulatory Visit: Payer: Self-pay | Admitting: Family Medicine

## 2022-03-27 DIAGNOSIS — Z1231 Encounter for screening mammogram for malignant neoplasm of breast: Secondary | ICD-10-CM | POA: Diagnosis not present

## 2022-03-27 DIAGNOSIS — Z780 Asymptomatic menopausal state: Secondary | ICD-10-CM | POA: Diagnosis not present

## 2022-03-27 DIAGNOSIS — E049 Nontoxic goiter, unspecified: Secondary | ICD-10-CM

## 2022-03-27 DIAGNOSIS — E2839 Other primary ovarian failure: Secondary | ICD-10-CM

## 2022-03-27 DIAGNOSIS — E079 Disorder of thyroid, unspecified: Secondary | ICD-10-CM | POA: Diagnosis not present

## 2022-03-27 DIAGNOSIS — M8588 Other specified disorders of bone density and structure, other site: Secondary | ICD-10-CM | POA: Diagnosis not present

## 2022-04-14 DIAGNOSIS — M8588 Other specified disorders of bone density and structure, other site: Secondary | ICD-10-CM | POA: Diagnosis not present

## 2022-04-29 ENCOUNTER — Other Ambulatory Visit: Payer: Self-pay

## 2022-04-29 ENCOUNTER — Inpatient Hospital Stay: Payer: 59 | Attending: Genetic Counselor | Admitting: Genetic Counselor

## 2022-04-29 ENCOUNTER — Inpatient Hospital Stay: Payer: 59

## 2022-04-29 DIAGNOSIS — Z8041 Family history of malignant neoplasm of ovary: Secondary | ICD-10-CM

## 2022-04-29 DIAGNOSIS — Z800 Family history of malignant neoplasm of digestive organs: Secondary | ICD-10-CM

## 2022-04-29 DIAGNOSIS — Z803 Family history of malignant neoplasm of breast: Secondary | ICD-10-CM

## 2022-04-29 LAB — GENETIC SCREENING ORDER

## 2022-04-29 NOTE — Progress Notes (Signed)
REFERRING PROVIDER: Harlan Stains, MD [facility],  [place] [address]  PRIMARY PROVIDER:  Harlan Stains, MD  PRIMARY REASON FOR VISIT:  1. Family history of ovarian cancer   2. Family history of breast cancer   3. Family history of colon cancer     HISTORY OF PRESENT ILLNESS:   Judy Ortiz, a 61 y.o. female, was seen for a [place] cancer genetics consultation at the request of Dr. Dema Severin due to a family history of cancer.  Judy Ortiz presents to clinic today to discuss the possibility of a hereditary predisposition to cancer, to discuss genetic testing, and to further clarify her future cancer risks, as well as potential cancer risks for family members.   CANCER HISTORY:  Judy Ortiz is a 61 y.o. female with no personal history of cancer. She had [facility]'s Breast/Ovarian gene panel in 2017 which included sequencing and rearrangement analysis for the following 20 genes:  ATM, BARD1, BRCA1, BRCA2, BRIP1, CDH1, CHEK2, EPCAM, FANCC, MLH1, MSH2, MSH6, NBN, PALB2, PMS2, PTEN, RAD51C, RAD51D, TP53, and XRCC2.  Her genetic testing results were Negative.  RISK FACTORS:  Menarche was at age 55-12.  First live birth at age 17.  OCP use for approximately  25  years.  Ovaries intact: yes.  Uterus intact: yes.  Menopausal status: perimenopausal.  HRT use: 0 years. Colonoscopy: yes;  2016- 1 polyp . Mammogram within the last year: yes. Any excessive radiation exposure in the past: no  Past Medical History:  Diagnosis Date   Allergy    seasonal   Family history of breast cancer    Family history of colon cancer    Family history of ovarian cancer    Thyroid nodule     Past Surgical History:  Procedure Laterality Date   WISDOM TOOTH EXTRACTION     with sedation    Social History   Socioeconomic History   Marital status: Single    Spouse name: Not on file   Number of children: 0   Years of education: 16   Highest education level: Not on file  Occupational  History   Occupation: Admin    Employer: [employer]  Tobacco Use   Smoking status: Never   Smokeless tobacco: Never  Substance and Sexual Activity   Alcohol use: Yes    Alcohol/week: 0.0 standard drinks    Comment: occasionally   Drug use: No   Sexual activity: Not on file  Other Topics Concern   Not on file  Social History Narrative   Not on file   Social Determinants of Health   Financial Resource Strain: Not on file  Food Insecurity: Not on file  Transportation Needs: Not on file  Physical Activity: Not on file  Stress: Not on file  Social Connections: Not on file     FAMILY HISTORY:  We obtained a detailed, 4-generation family history.  Significant diagnoses are listed below: Family History  Problem Relation Age of Onset   Hypertension Other        Grandparents   Colon cancer Maternal Grandmother        17's   Ovarian cancer Mother 41   Liver disease Father    Ovarian cancer Maternal Aunt 62   Breast cancer Other        MGF's mother   Colon polyps Neg Hx    Esophageal cancer Neg Hx    Rectal cancer Neg Hx    Stomach cancer Neg Hx  Judy Ortiz does not have children and is the only child to her parents.  Her mother was diagnosed and died of ovarian cancer at age 11.  Her mother has a full sister and a maternal half sister.  The full sister was diagnosed with ovarian cancer at 41 and died at 68.  The patient's maternal grandmother was diagnosed with colon cancer in her 19's and her maternal grandfather's mother had breast cancer.  The patient's father died of liver disease at 17.  He had two brothers and three sisters.  One sister had an unknown form of cancer and this sister's daughter died of an unknown form of cancer.  There is no other reported family history of cancer.  Patient's maternal ancestors are of [place] American and Native American descent, and paternal ancestors are of African American descent. There is no reported Ashkenazi Jewish ancestry. There  is no known consanguinity.  GENETIC COUNSELING ASSESSMENT: Judy Ortiz is a 61 y.o. female with a family history of cancer which is somewhat suggestive of a hereditary predisposition to cancer. We, therefore, discussed and recommended the following at today's visit.   DISCUSSION: We discussed that 5 - 10% of cancer is hereditary, with most cases of hereditary breast and ovarian cancer associated with BRCA1/2.  There are other genes that can be associated with hereditary ovarian cancer syndromes.  We discussed that testing is beneficial for several reasons, including knowing about other cancer risks, identifying potential screening and risk-reduction options that may be appropriate, and to understanding if other family members could be at risk for cancer and allowing them to undergo genetic testing.  We reviewed the characteristics, features and inheritance patterns of hereditary cancer syndromes. We also discussed genetic testing, including the appropriate family members to test, the process of testing, insurance coverage and turn-around-time for results. We discussed the implications of a negative, positive, carrier and/or variant of uncertain significant result.   Judy Ortiz was offered a common hereditary cancer panel (47 genes) and an expanded pan-cancer panel (77 genes). Judy Ortiz was informed of the benefits and limitations of each panel, including that expanded pan-cancer panels contain several genes that do not have clear management guidelines at this point in time.  We also discussed that as the number of genes included on a panel increases, the chances of variants of uncertain significance increases.  After considering the benefits and limitations of each gene panel, Judy Ortiz elected to have [facility] Panel.  The CancerNext-Expanded gene panel offered by [facility] and includes sequencing, rearrangement, and RNA analysis for the following 77 genes: AIP, ALK, APC, ATM, AXIN2,  BAP1, BARD1, BLM, BMPR1A, BRCA1, BRCA2, BRIP1, CDC73, CDH1, CDK4, CDKN1B, CDKN2A, CHEK2, CTNNA1, DICER1, FANCC, FH, FLCN, GALNT12, KIF1B, LZTR1, MAX, MEN1, MET, MLH1, MSH2, MSH3, MSH6, MUTYH, NBN, NF1, NF2, NTHL1, PALB2, PHOX2B, PMS2, POT1, PRKAR1A, PTCH1, PTEN, RAD51C, RAD51D, RB1, RECQL, RET, SDHA, SDHAF2, SDHB, SDHC, SDHD, SMAD4, SMARCA4, SMARCB1, SMARCE1, STK11, SUFU, TMEM127, TP53, TSC1, TSC2, VHL and XRCC2 (sequencing and deletion/duplication); EGFR, EGLN1, HOXB13, KIT, MITF, PDGFRA, POLD1, and POLE (sequencing only); EPCAM and GREM1 (deletion/duplication only).   Based on Judy Ortiz's family history of cancer, she meets medical criteria for genetic testing. Despite that she meets criteria, she may still have an out of pocket cost. We discussed that if her out of pocket cost for testing is over $100, the laboratory will call and confirm whether she wants to proceed with testing.  If the out of pocket cost of testing is less  than $100 she will be billed by the genetic testing laboratory.   PLAN: After considering the risks, benefits, and limitations, Judy Ortiz provided informed consent to pursue genetic testing and the blood sample was sent to [facility] for analysis of the CancerNext-Expanded Panel. Results should be available within approximately 2-3 weeks' time, at which point they will be disclosed by telephone to Judy Ortiz, as will any additional recommendations warranted by these results. Judy Ortiz will receive a summary of her genetic counseling visit and a copy of her results once available. This information will also be available in Epic.   Judy Ortiz questions were answered to her satisfaction today. Our contact information was provided should additional questions or concerns arise. Thank you for the referral and allowing [place] to share in the care of your patient.   Lucille Passy, MS, [facility] Genetic Counselor [email or web address] (P) [phone]  The patient was seen for a  total of 30 minutes in face-to-face genetic counseling. The patient was seen alone.  Drs. Lindi Adie and/or Burr Medico were available to discuss this case as needed.  _______________________________________________________________________ For Office Staff:  Number of people involved in session: 1 Was an Intern/ student involved with case: no

## 2022-05-15 ENCOUNTER — Telehealth: Payer: Self-pay | Admitting: Genetic Counselor

## 2022-05-15 NOTE — Telephone Encounter (Signed)
I contacted Ms. Stucky to discuss her genetic testing results. No pathogenic variants were identified in the 77 genes analyzed. Detailed clinic note to follow.  The test report has been scanned into EPIC and is located under the Molecular Pathology section of the Results Review tab.  A portion of the result report is included below for reference.   Lucille Passy, MS, [facility] Genetic Counselor [email or web address] (P) [phone]

## 2022-05-19 ENCOUNTER — Ambulatory Visit: Payer: Self-pay | Admitting: Genetic Counselor

## 2022-05-19 DIAGNOSIS — Z1379 Encounter for other screening for genetic and chromosomal anomalies: Secondary | ICD-10-CM

## 2022-05-22 ENCOUNTER — Encounter: Payer: Self-pay | Admitting: Genetic Counselor

## 2022-06-30 IMAGING — MG MM DIGITAL SCREENING BILAT W/ TOMO AND CAD
8 series · 8 of 24 positions shown · non-contrast
Comparison: Previous exam(s).

CLINICAL DATA: Screening.

EXAM:
DIGITAL SCREENING BILATERAL MAMMOGRAM WITH TOMOSYNTHESIS AND CAD
TECHNIQUE: Bilateral screening digital craniocaudal and mediolateral oblique
mammograms were obtained. Bilateral screening digital breast
tomosynthesis was performed. The images were evaluated with
computer-aided detection.

[R MLO synth-2D]
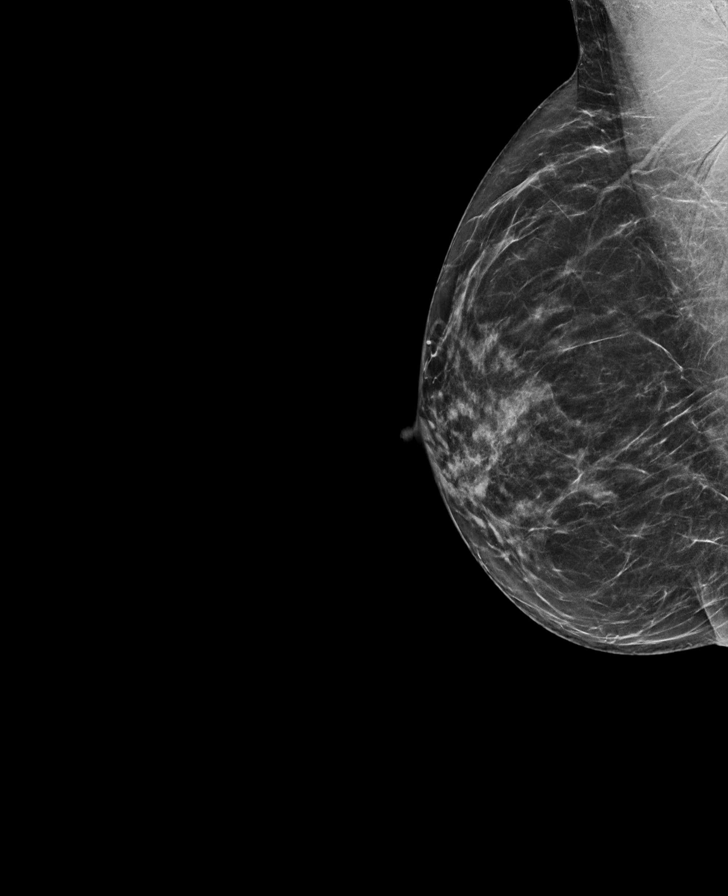

[L MLO synth-2D]
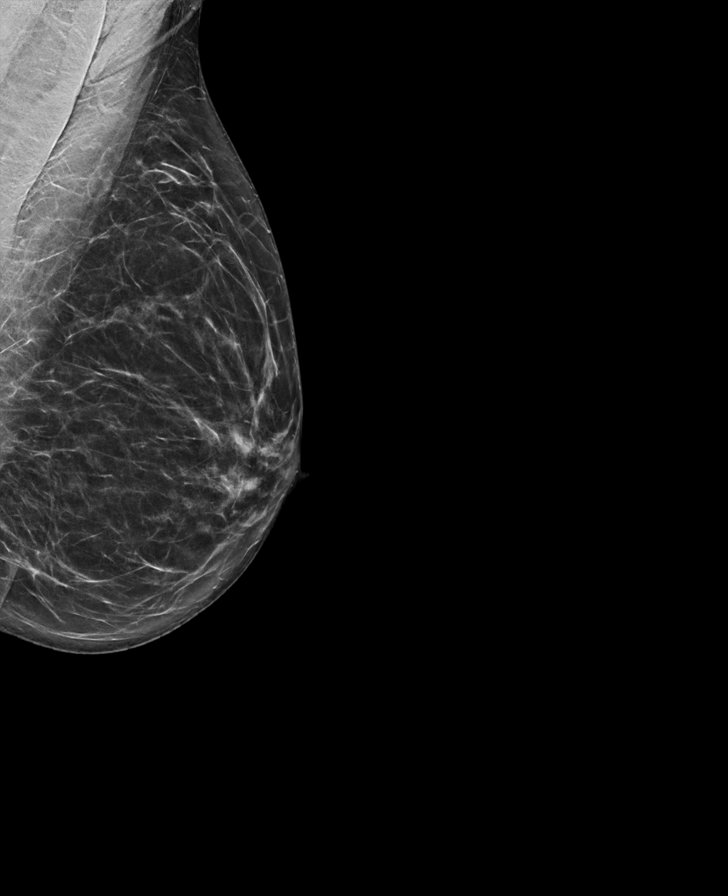

[L CC synth-2D]
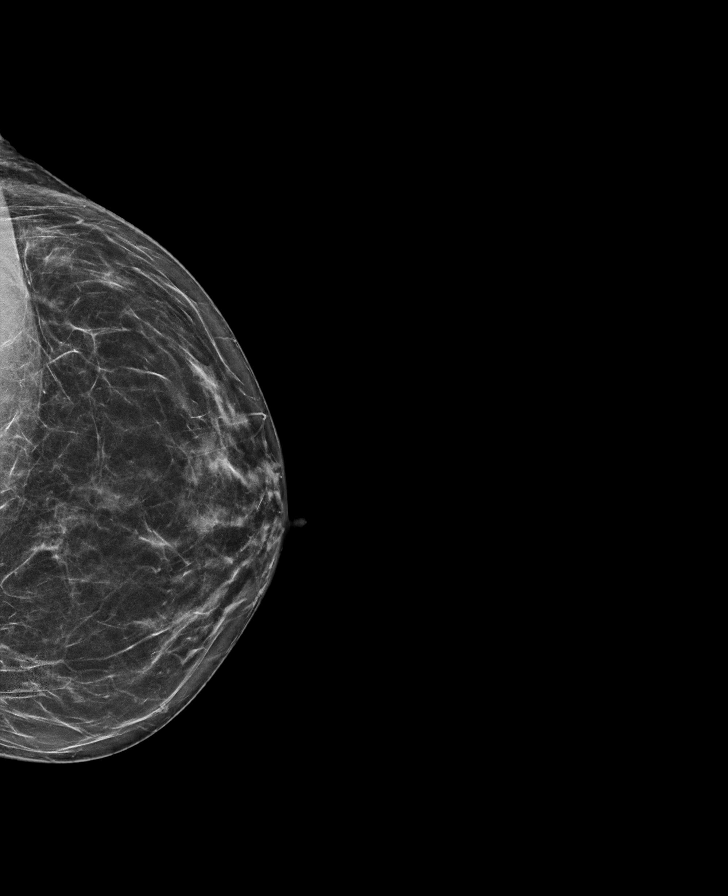

[R CC synth-2D]
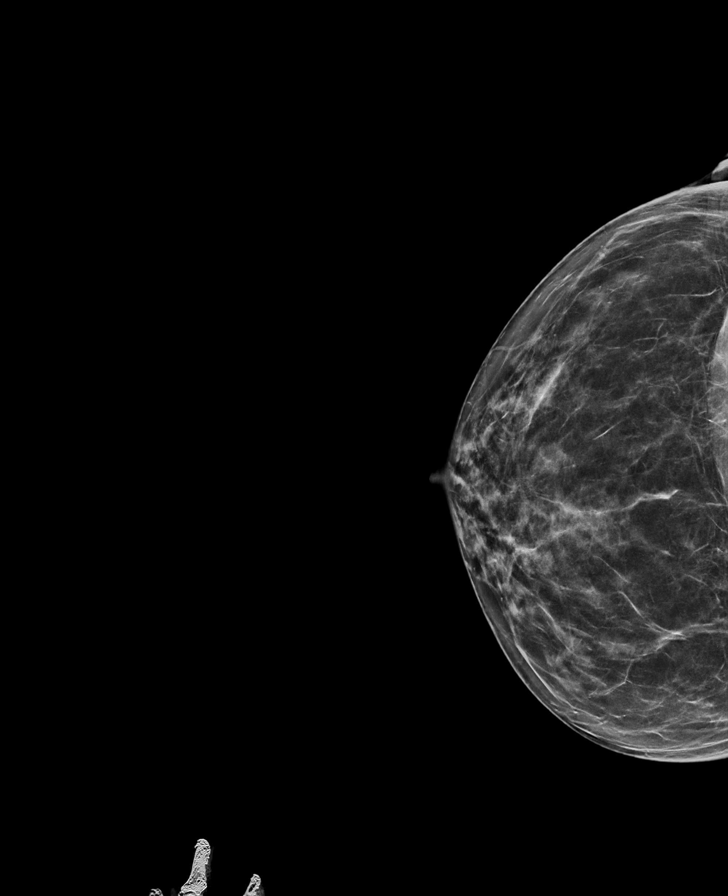

[L CC tomo · tomo slice 33/65.0]
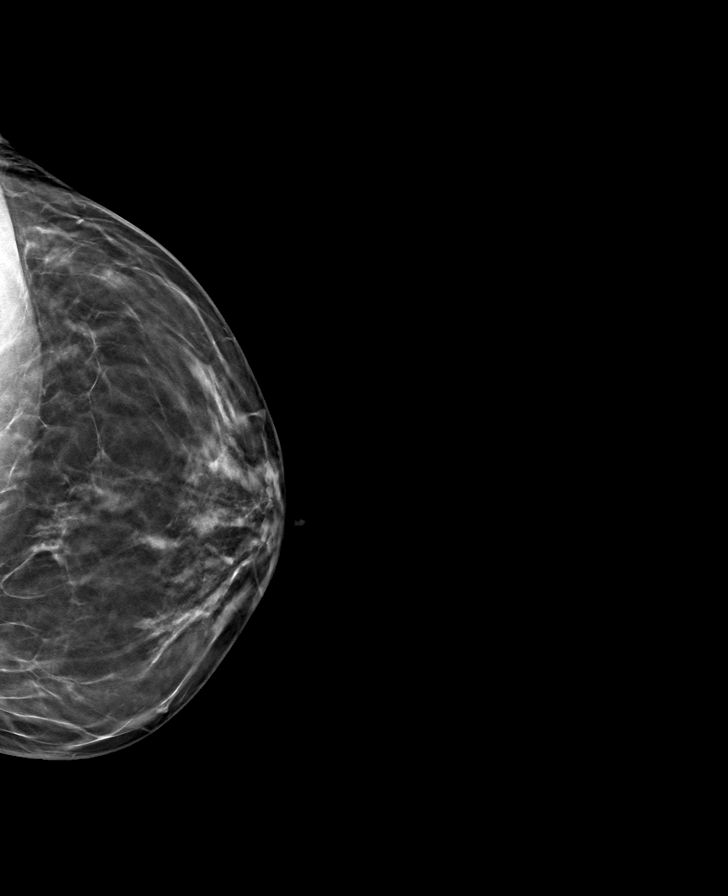

[L MLO tomo · tomo slice 34/67.0]
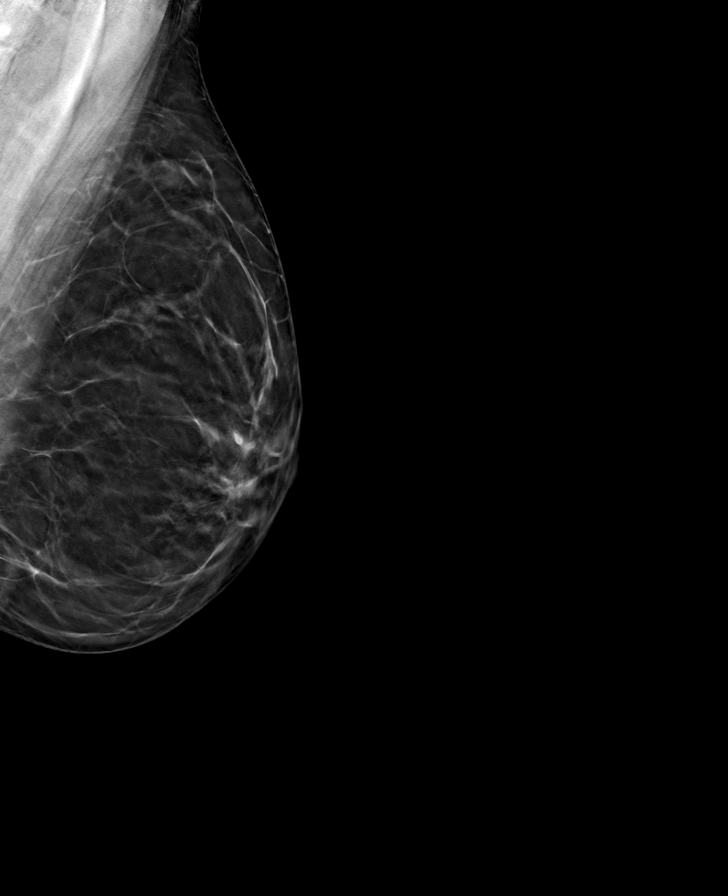

[R MLO tomo · tomo slice 33/65.0]
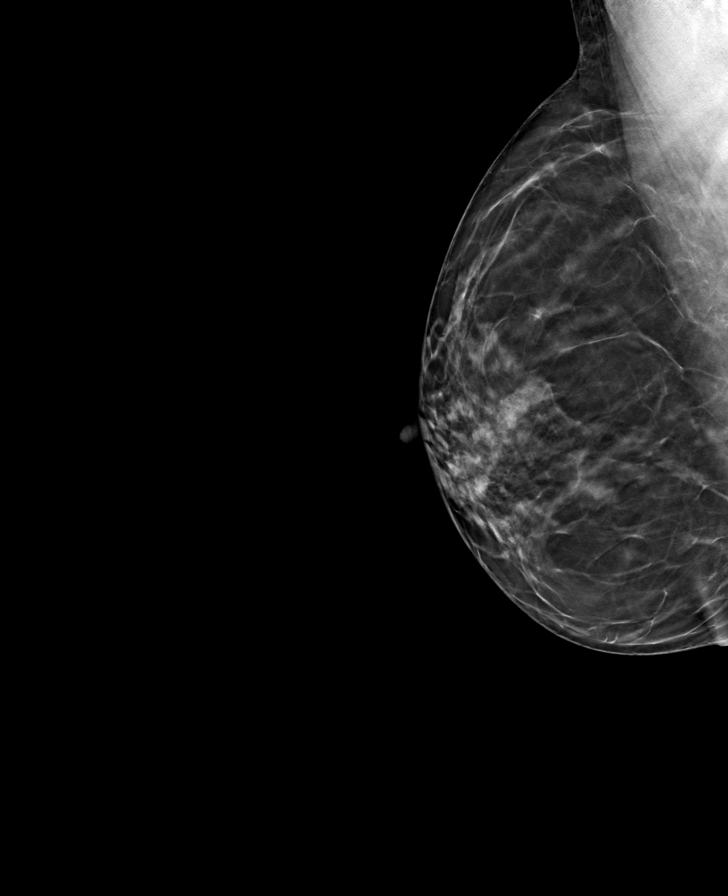

[R CC tomo · tomo slice 33/64.0]
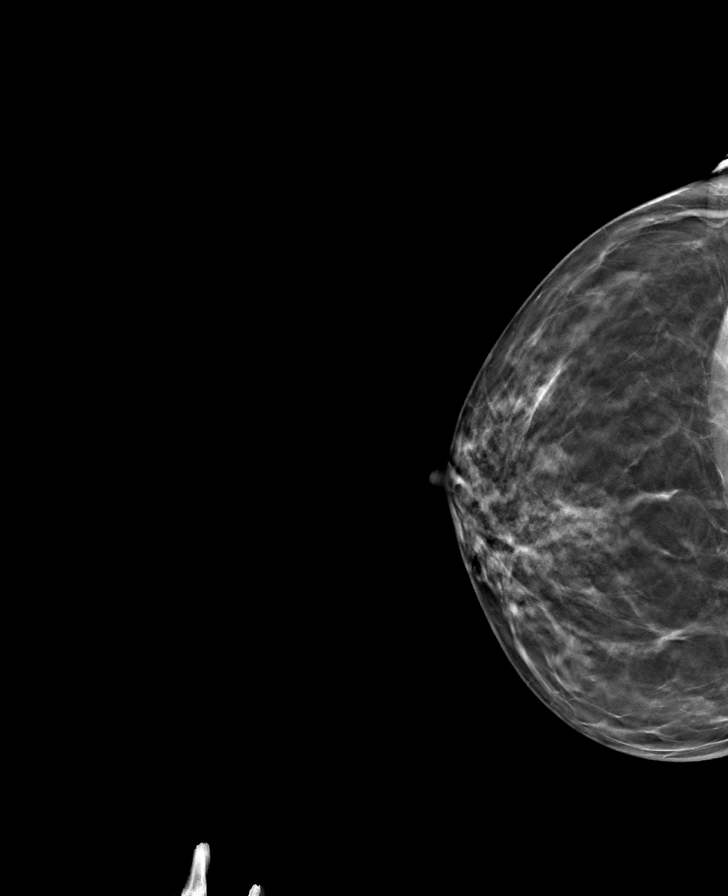

[8 of 24 positions shown; findings below may reference images not displayed]

ACR Breast Density Category b: There are scattered areas of
fibroglandular density.
FINDINGS: There are no findings suspicious for malignancy.
IMPRESSION: No mammographic evidence of malignancy. A result letter of this
screening mammogram will be mailed directly to the patient.

RECOMMENDATION:
Screening mammogram in one year. (Code:51-O-LD2)

BI-RADS CATEGORY  1: Negative.

## 2022-09-16 ENCOUNTER — Other Ambulatory Visit: Payer: Self-pay

## 2022-09-16 MED ORDER — COVID-19 MRNA 2023-2024 VACCINE (COMIRNATY) 0.3 ML INJECTION
0.3000 mL | Freq: Once | INTRAMUSCULAR | 0 refills | Status: AC
  Filled 2022-09-16: qty 0.3, 1d supply, fill #0

## 2023-02-04 ENCOUNTER — Encounter: Payer: Self-pay | Admitting: Gastroenterology

## 2023-03-17 ENCOUNTER — Other Ambulatory Visit (HOSPITAL_COMMUNITY): Payer: Self-pay

## 2023-03-17 DIAGNOSIS — Z0000 Encounter for general adult medical examination without abnormal findings: Secondary | ICD-10-CM | POA: Diagnosis not present

## 2023-03-17 DIAGNOSIS — J301 Allergic rhinitis due to pollen: Secondary | ICD-10-CM | POA: Diagnosis not present

## 2023-03-17 DIAGNOSIS — R030 Elevated blood-pressure reading, without diagnosis of hypertension: Secondary | ICD-10-CM | POA: Diagnosis not present

## 2023-03-17 DIAGNOSIS — M8588 Other specified disorders of bone density and structure, other site: Secondary | ICD-10-CM | POA: Diagnosis not present

## 2023-03-17 DIAGNOSIS — Z86010 Personal history of colonic polyps: Secondary | ICD-10-CM | POA: Diagnosis not present

## 2023-03-17 DIAGNOSIS — N952 Postmenopausal atrophic vaginitis: Secondary | ICD-10-CM | POA: Diagnosis not present

## 2023-03-17 DIAGNOSIS — E785 Hyperlipidemia, unspecified: Secondary | ICD-10-CM | POA: Diagnosis not present

## 2023-03-17 DIAGNOSIS — E049 Nontoxic goiter, unspecified: Secondary | ICD-10-CM | POA: Diagnosis not present

## 2023-03-17 MED ORDER — FLUTICASONE PROPIONATE 50 MCG/ACT NA SUSP
2.0000 | Freq: Every day | NASAL | 3 refills | Status: AC
  Filled 2023-03-17: qty 16, 30d supply, fill #0

## 2023-03-24 ENCOUNTER — Other Ambulatory Visit (HOSPITAL_COMMUNITY): Payer: Self-pay

## 2023-09-23 ENCOUNTER — Other Ambulatory Visit (HOSPITAL_COMMUNITY): Payer: Self-pay

## 2023-09-23 MED ORDER — INFLUENZA VAC TISS-CULT SUBUNT 0.5 ML IM SUSY
0.5000 mL | PREFILLED_SYRINGE | Freq: Once | INTRAMUSCULAR | 0 refills | Status: AC
  Filled 2023-09-23: qty 0.5, 1d supply, fill #0

## 2024-04-27 ENCOUNTER — Other Ambulatory Visit (HOSPITAL_BASED_OUTPATIENT_CLINIC_OR_DEPARTMENT_OTHER): Payer: Self-pay

## 2024-04-27 ENCOUNTER — Other Ambulatory Visit (HOSPITAL_COMMUNITY): Payer: Self-pay

## 2024-04-27 DIAGNOSIS — J3089 Other allergic rhinitis: Secondary | ICD-10-CM | POA: Diagnosis not present

## 2024-04-27 DIAGNOSIS — H1045 Other chronic allergic conjunctivitis: Secondary | ICD-10-CM | POA: Diagnosis not present

## 2024-04-27 MED ORDER — LEVOCETIRIZINE DIHYDROCHLORIDE 5 MG PO TABS
5.0000 mg | ORAL_TABLET | Freq: Every evening | ORAL | 3 refills | Status: AC
  Filled 2024-04-27 (×2): qty 30, 30d supply, fill #0

## 2024-04-27 MED ORDER — FLUTICASONE PROPIONATE 50 MCG/ACT NA SUSP
1.0000 | Freq: Every day | NASAL | 3 refills | Status: AC
  Filled 2024-04-27 (×2): qty 16, 30d supply, fill #0

## 2024-09-21 ENCOUNTER — Other Ambulatory Visit (HOSPITAL_COMMUNITY): Payer: Self-pay

## 2024-09-21 MED ORDER — COVID-19 MRNA VAC-TRIS(PFIZER) 30 MCG/0.3ML IM SUSY
0.3000 mL | PREFILLED_SYRINGE | Freq: Once | INTRAMUSCULAR | 0 refills | Status: AC
  Filled 2024-09-21: qty 0.3, 1d supply, fill #0

## 2024-09-21 MED ORDER — FLUBLOK 0.5 ML IM SOSY
0.5000 mL | PREFILLED_SYRINGE | Freq: Once | INTRAMUSCULAR | 0 refills | Status: AC
  Filled 2024-09-21: qty 0.5, 1d supply, fill #0
